# Patient Record
Sex: Female | Born: 1975 | Race: White | Hispanic: No | Marital: Single
Health system: Southern US, Community
[De-identification: ages and names within clinical notes are randomized; demographics above are authoritative.]

## PROBLEM LIST (undated history)

## (undated) HISTORY — PX: THROAT SURGERY: SHX803

---

## 2008-05-06 ENCOUNTER — Emergency Department: Payer: Self-pay | Admitting: Emergency Medicine

## 2015-10-12 DEATH — deceased

## 2020-05-02 ENCOUNTER — Emergency Department (HOSPITAL_COMMUNITY): Payer: Medicare Other

## 2020-05-02 ENCOUNTER — Other Ambulatory Visit: Payer: Self-pay

## 2020-05-02 ENCOUNTER — Emergency Department (HOSPITAL_COMMUNITY)
Admission: EM | Admit: 2020-05-02 | Discharge: 2020-05-02 | Disposition: A | Discharge status: Home / Self Care | Payer: Medicare Other | Attending: Emergency Medicine | Admitting: Emergency Medicine

## 2020-05-02 ENCOUNTER — Encounter (HOSPITAL_COMMUNITY): Payer: Self-pay

## 2020-05-02 DIAGNOSIS — R079 Chest pain, unspecified: Secondary | ICD-10-CM | POA: Insufficient documentation

## 2020-05-02 MED ORDER — METHOCARBAMOL 500 MG PO TABS: 500.0000 mg | ORAL_TABLET | Freq: Two times a day (BID) | ORAL | 0 refills | Status: AC | PRN

## 2020-05-02 MED ORDER — HYDROMORPHONE HCL 1 MG/ML IJ SOLN
1.0000 mg | Freq: Once | INTRAMUSCULAR | Status: AC
  Administered 2020-05-02: 1 mg via INTRAVENOUS
  Filled 2020-05-02: qty 1

## 2020-05-02 NOTE — ED Triage Notes (Signed)
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  Pt states that she had her surgery at [facility], states that she went back on Monday and today she is here for chest pain and it feels the same.  States that her md wanted her to get a barium swallow.  States that she is keeping most of her food down, but vomits about once a day

## 2020-05-02 NOTE — Discharge Instructions (Signed)
Please follow-up with your doctors in [place] as needed.  You do not have any abnormalities on the swallow study today to suggest that there is an ongoing leak.  Everything seems to be working correctly and the stent is in the correct place.

## 2020-05-02 NOTE — ED Provider Notes (Signed)
[facility] EMERGENCY DEPARTMENT Provider Note   CSN: [id number] Arrival date & time: 05/02/20  1259     History Chief Complaint  Patient presents with   Chest Pain    Yesenia Higgins is a 44 y.o. female.  HPI   This patient is a 44 year old female, she reports that she has been otherwise healthy until recently when she developed what she describes as an esophageal perforation during an episode of vomiting.  This occurred initially on July 6, she had been admitted to an outside hospital, she had a swallow study (she thinks) which showed a likely perforation of the esophagus.  After 6 or 7 days of no significant improvement she had a stent placed in the esophagus.  That was initially performed on July 13.  A swallow study afterwards showed that there was possible migration of the stent, and she went back for a repeat stent.  She was then released from the hospital.  She had gone back to the hospital where she had a CT scan on Monday, the CT scan she believes was of the chest abdomen and pelvis.  The CT scan that she had on July 20 showed that the patient had a stent in place terminating in the appropriate position, there is no pneumomediastinum, there was a small fluid collection measuring 0.9 cm at the level of the mid stent.  There had been no CT scan and imaging showing pancreatitis despite a slightly elevated lab work.  Because of ongoing chest pain, ongoing nausea and vomiting she presents back to the hospital today after calling her gastroenterologist at the [facility] [place] where she had her testing done.  They recommended that she come to the nearest emergency department.  She denies fevers or chills, she has had diarrhea, she has been taking hydromorphone at home but states she has had to take some laxatives because of the constipation.  No blood in the stool, denies any abdominal pain, the pain is primarily in the mid chest.  Again she denies fevers or chills, she has maintained  a liquid diet  No past medical history on file. Esophageal rupture Esophageal stent  There are no problems to display for this patient.      OB History   No obstetric history on file.     No family history on file.  Social History   Tobacco Use   Smoking status: Not on file  Substance Use Topics    History of alcohol use    Drug use: Not on file    Home Medications Prior to Admission medications   Not on File    Allergies    Patient has no allergy information on record.  Review of Systems   Review of Systems  All other systems reviewed and are negative.   Physical Exam Updated Vital Signs BP (!) 133/70 (BP Location: Right Arm)    Pulse 81    Temp 98.2 F (36.8 C) (Oral)    Resp 18    Ht 1.727 m (5\' 8" )    Wt 68 kg    LMP 04/11/2020    SpO2 97%    BMI 22.81 kg/m   Physical Exam Vitals and nursing note reviewed.  Constitutional:      General: She is not in acute distress.    Appearance: She is well-developed.  HENT:     Head: Normocephalic and atraumatic.     Mouth/Throat:     Pharynx: No oropharyngeal exudate.  Eyes:  General: No scleral icterus.       Right eye: No discharge.        Left eye: No discharge.     Conjunctiva/sclera: Conjunctivae normal.     Pupils: Pupils are equal, round, and reactive to light.  Neck:     Thyroid: No thyromegaly.     Vascular: No JVD.  Cardiovascular:     Rate and Rhythm: Normal rate and regular rhythm.     Heart sounds: Normal heart sounds. No murmur heard.  No friction rub. No gallop.   Pulmonary:     Effort: Pulmonary effort is normal. No respiratory distress.     Breath sounds: Normal breath sounds. No wheezing or rales.  Abdominal:     General: Bowel sounds are normal. There is no distension.     Palpations: Abdomen is soft. There is no mass.     Tenderness: There is no abdominal tenderness.  Musculoskeletal:        General: No tenderness. Normal range of motion.     Cervical back: Normal range of  motion and neck supple.  Lymphadenopathy:     Cervical: No cervical adenopathy.  Skin:    General: Skin is warm and dry.     Findings: No erythema or rash.  Neurological:     Mental Status: She is alert.     Coordination: Coordination normal.  Psychiatric:        Behavior: Behavior normal.     ED Results / Procedures / Treatments   Labs (all labs ordered are listed, but only abnormal results are displayed) Labs Reviewed - No data to display  EKG None  Radiology DG ESOPHAGUS W SINGLE CM (SOL OR THIN BA)  Result Date: 05/02/2020 CLINICAL DATA:  Recent [place] esophagus, esophageal rupture after vomiting, post esophageal stenting on 04/23/2020, having epigastric and lower chest pain EXAM: ESOPHOGRAM/BARIUM SWALLOW TECHNIQUE: Single contrast examination was performed using water soluble contrast followed by thin barium. FLUOROSCOPY TIME:  Fluoroscopy Time:  1 minutes 36 seconds Radiation Exposure Index (if provided by the fluoroscopic device): 32.8 mGy Number of Acquired Spot Images: Screen capture of multiple fluoroscopic series COMPARISON:  None FINDINGS: Esophageal stent identified at distal thoracic esophagus extending into stomach. Curvilinear surgical clip present at distal esophagus just proximal to the proximal end of the stent. Esophagus distends normally without mass or stricture. Slight esophageal wall irregularity at the level of the clip. No extravasation of water-soluble contrast identified. Subsequent use of thin barium demonstrates patent esophageal stent without contrast extravasation. IMPRESSION: Post esophageal stenting and placement of a clip at the distal esophagus. No evidence of contrast extravasation from the esophagus. Electronically Signed   By: Ulyses Southward M.D.   On: 05/02/2020 16:33    Procedures Procedures (including critical care time)  Medications Ordered in ED Medications  HYDROmorphone (DILAUDID) injection 1 mg (1 mg Intravenous Given 05/02/20 1549)     ED Course  I have reviewed the triage vital signs and the nursing notes.  Pertinent labs & imaging results that were available during my care of the patient were reviewed by me and considered in my medical decision making (see chart for details).  Clinical Course as of May 02 1649  Fri May 02, 2020  1646 DG ESOPHAGUS W SINGLE CM (SOL OR THIN BA) [BM]    Clinical Course User Index [BM] Eber Hong, MD   MDM Rules/Calculators/A&P  The patient's exam is reassuring, there is no clinical pneumomediastinum, heart and lung sounds are totally normal, vital signs are totally normal, swallow study ordered  I doubt that the patient has any infections in the mediastinum, currently on Augmentin, afebrile with normal vital signs and a relatively normal exam.  No doubt she has ongoing pain, will check for perforation with swallow study, discussed with radiologist who agrees that this can be done at this time.  Patient will be given a dose of pain medicine  Swallow study negative for perforation or significant irregularity of the esophagus, stent is patent and in the right correct location.  Patient updated, stable for discharge  Final Clinical Impression(s) / ED Diagnoses Final diagnoses:  Chest pain  Chest pain, unspecified type    Rx / DC Orders ED Discharge Orders    None       Eber Hong, MD 05/02/20 1650

## 2021-10-21 IMAGING — RF DG ESOPHAGUS
7 series · 14 of 24 positions shown · non-contrast
Comparison: None

CLINICAL DATA: Recent Con esophagus, esophageal rupture after
vomiting, post esophageal stenting on 04/23/2020, having epigastric
and lower chest pain

EXAM:
ESOPHOGRAM/BARIUM SWALLOW
TECHNIQUE: Single contrast examination was performed using water soluble
contrast followed by thin barium.
FLUOROSCOPY TIME:  Fluoroscopy Time:  1 minutes 36 seconds
Radiation Exposure Index (if provided by the fluoroscopic device):
32.8 mGy
Number of Acquired Spot Images: Screen capture of multiple
fluoroscopic series

[Series 1: cp_standard · 0.18mm/px · 2 of 107 frames shown (1 of 7)]
[frame 10/107]
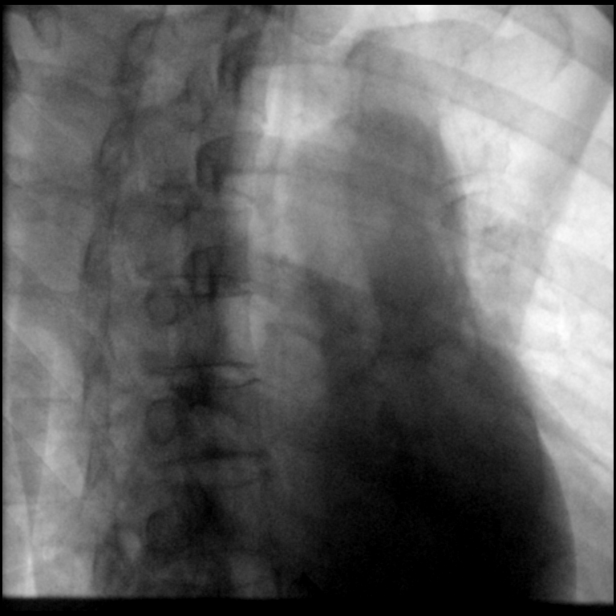
[frame 54/107]
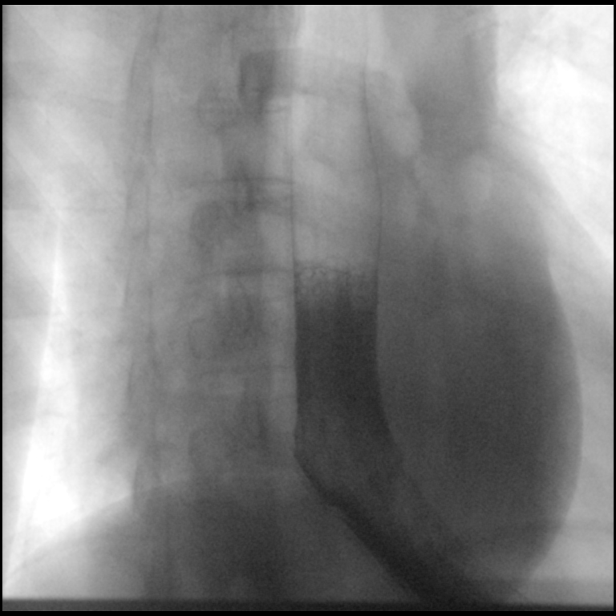

[Series 2: cp_standard · 0.18mm/px · 2 of 80 frames shown (2 of 7)]
[frame 23/80]
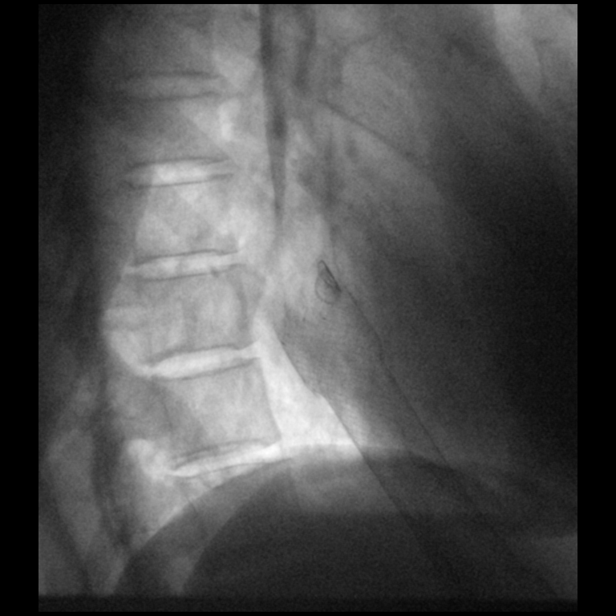
[frame 69/80]
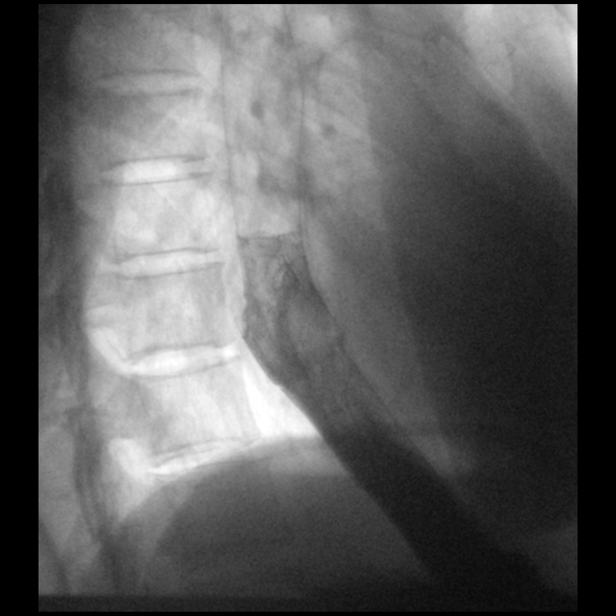

[Series 3: cp_standard · 0.19mm/px · 2 of 69 frames shown (3 of 7)]
[frame 11/69]
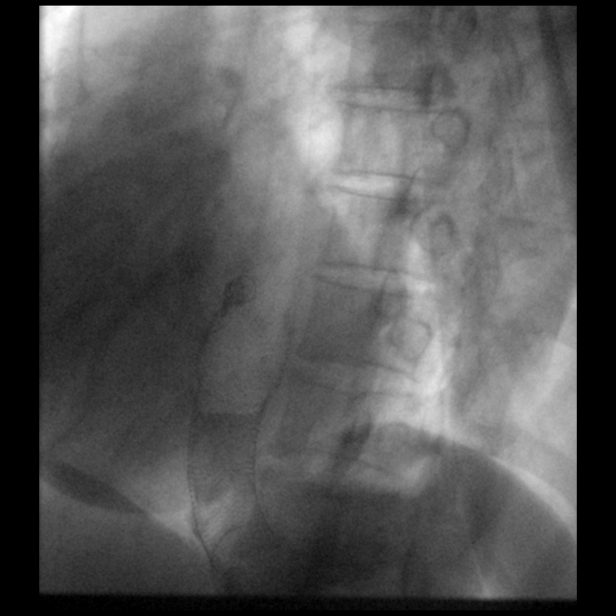
[frame 35/69]
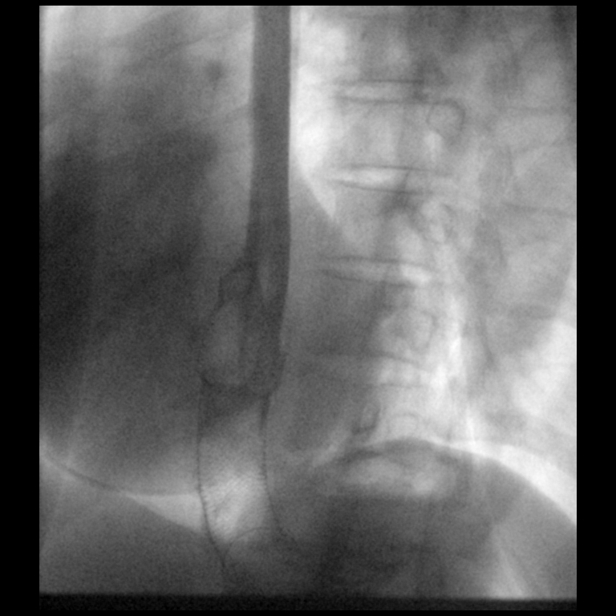

[Series 4: cp_standard · 0.19mm/px · 2 of 16 frames shown (4 of 7)]
[frame 3/16]
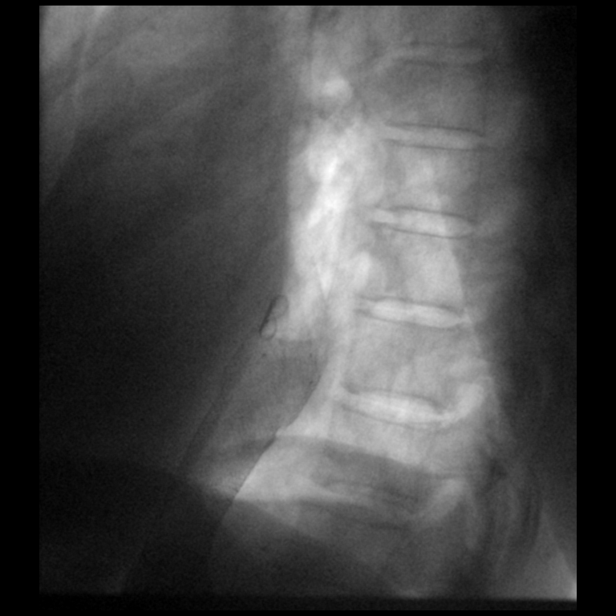
[frame 14/16]
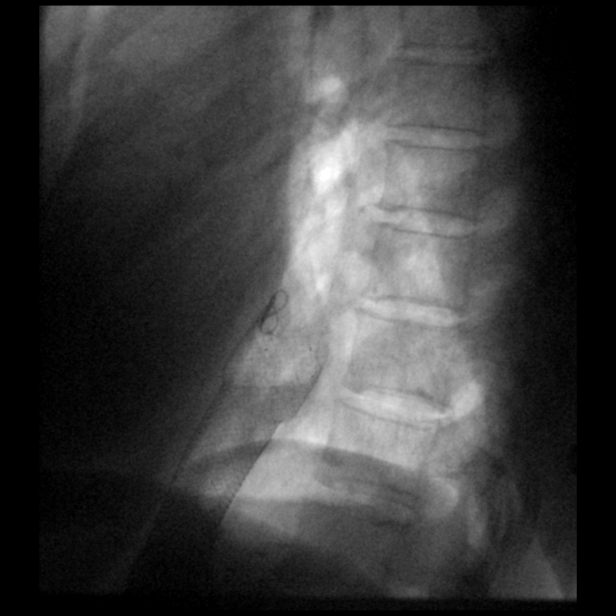

[Series 5: cp_standard · 0.19mm/px · 2 of 66 frames shown (5 of 7)]
[frame 10/66]
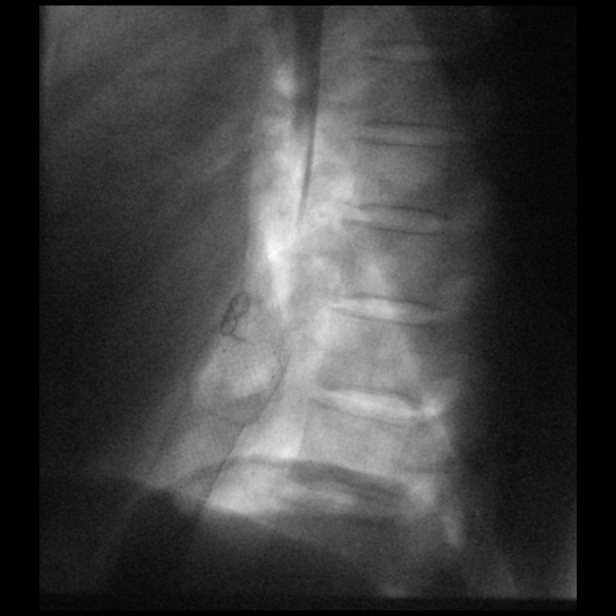
[frame 57/66]
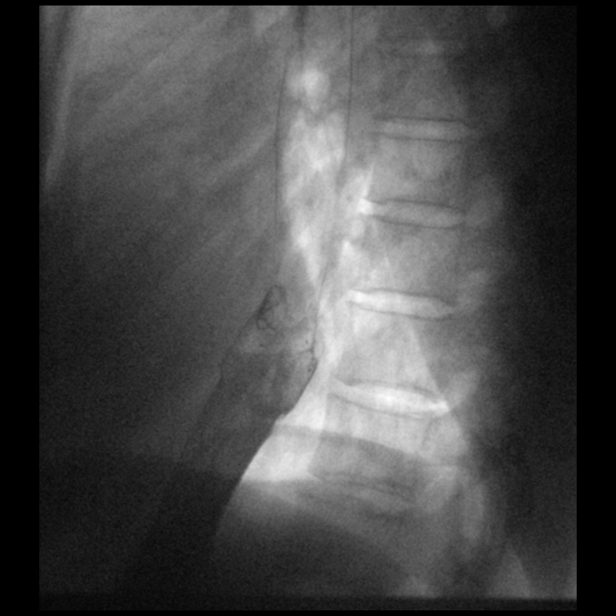

[Series 6: cp_standard · 0.19mm/px · 2 of 80 frames shown (6 of 7)]
[frame 41/80]
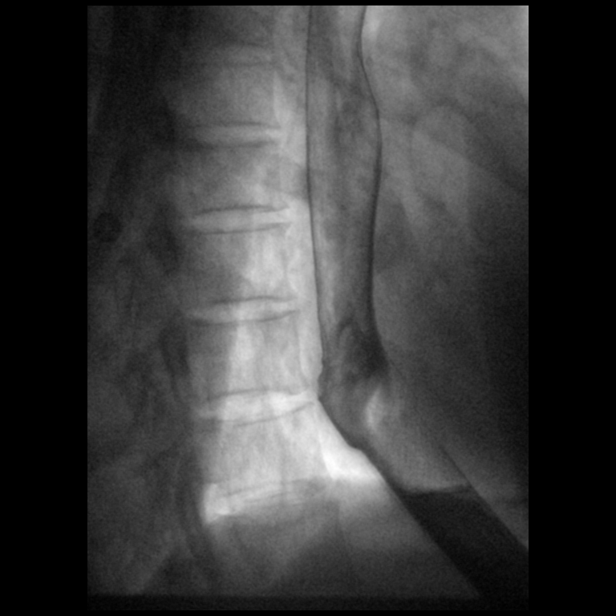
[frame 43/80]
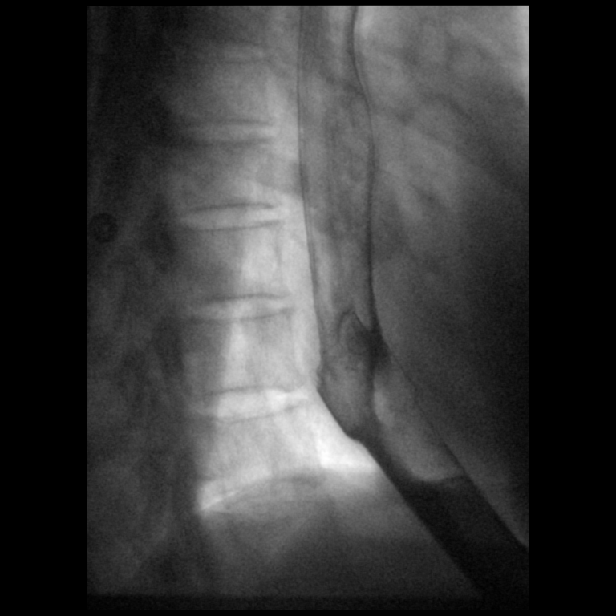

[Series 7: cp_standard · 0.18mm/px · 2 of 112 frames shown (7 of 7)]
[frame 40/112]
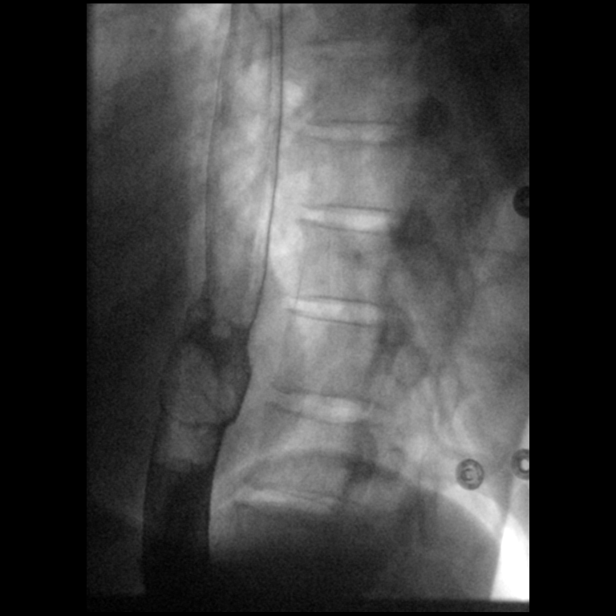
[frame 96/112]
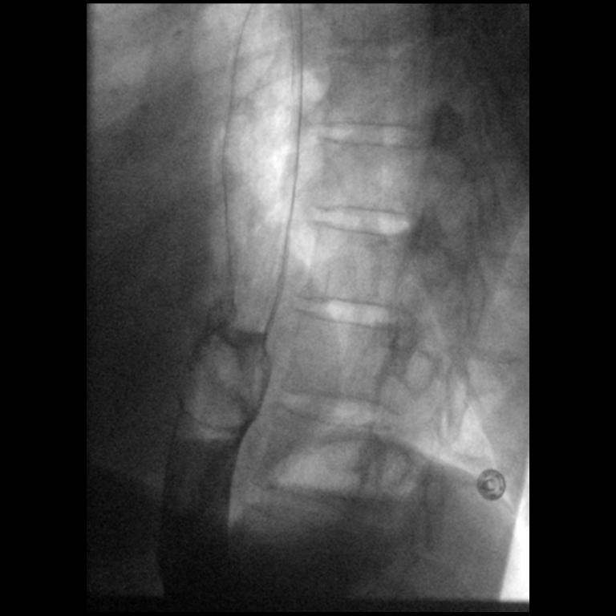

[14 of 24 positions shown; findings below may reference images not displayed]

FINDINGS: Esophageal stent identified at distal thoracic esophagus extending
into stomach.

Curvilinear surgical clip present at distal esophagus just proximal
to the proximal end of the stent.

Esophagus distends normally without mass or stricture.

Slight esophageal wall irregularity at the level of the clip.

No extravasation of water-soluble contrast identified.

Subsequent use of thin barium demonstrates patent esophageal stent
without contrast extravasation.
IMPRESSION: Post esophageal stenting and placement of a clip at the distal
esophagus.

No evidence of contrast extravasation from the esophagus.
